# Patient Record
Sex: Female | Born: 1975 | Hispanic: Yes | Marital: Married | Smoking: Never smoker
Health system: Southern US, Community
[De-identification: ages and names within clinical notes are randomized; demographics above are authoritative.]

## PROBLEM LIST (undated history)

## (undated) DIAGNOSIS — E119 Type 2 diabetes mellitus without complications: Secondary | ICD-10-CM

---

## 2005-10-18 ENCOUNTER — Emergency Department: Payer: Self-pay | Admitting: Unknown Physician Specialty

## 2007-08-28 ENCOUNTER — Ambulatory Visit: Payer: Self-pay | Admitting: Internal Medicine

## 2014-07-11 ENCOUNTER — Emergency Department: Payer: Self-pay

## 2014-07-11 ENCOUNTER — Encounter: Payer: Self-pay | Admitting: Emergency Medicine

## 2014-07-11 ENCOUNTER — Emergency Department
Admission: EM | Admit: 2014-07-11 | Discharge: 2014-07-11 | Disposition: A | Discharge status: Home / Self Care | Payer: Self-pay | Attending: Emergency Medicine | Admitting: Emergency Medicine

## 2014-07-11 DIAGNOSIS — G44209 Tension-type headache, unspecified, not intractable: Secondary | ICD-10-CM | POA: Insufficient documentation

## 2014-07-11 HISTORY — DX: Type 2 diabetes mellitus without complications: E11.9

## 2014-07-11 LAB — GLUCOSE, CAPILLARY: GLUCOSE-CAPILLARY: 219 mg/dL — AB (ref 70–99)

## 2014-07-11 MED ORDER — SODIUM CHLORIDE 0.9 % IV SOLN
20.0000 mg | Freq: Once | INTRAVENOUS | Status: AC
  Administered 2014-07-11: 20 mg via INTRAVENOUS
  Filled 2014-07-11: qty 4

## 2014-07-11 MED ORDER — METOCLOPRAMIDE HCL 5 MG/ML IJ SOLN
INTRAMUSCULAR | Status: AC
  Filled 2014-07-11: qty 2

## 2014-07-11 MED ORDER — BUTALBITAL-APAP-CAFFEINE 50-325-40 MG PO TABS: 1.0000 | ORAL_TABLET | Freq: Four times a day (QID) | ORAL | Status: AC | PRN

## 2014-07-11 MED ORDER — DIPHENHYDRAMINE HCL 50 MG/ML IJ SOLN
INTRAMUSCULAR | Status: AC
  Administered 2014-07-11: 25 mg via INTRAVENOUS
  Filled 2014-07-11: qty 1

## 2014-07-11 MED ORDER — METOCLOPRAMIDE HCL 5 MG/ML IJ SOLN
20.0000 mg | Freq: Once | INTRAVENOUS | Status: DC
  Filled 2014-07-11: qty 4

## 2014-07-11 MED ORDER — DIPHENHYDRAMINE HCL 50 MG/ML IJ SOLN
25.0000 mg | Freq: Once | INTRAMUSCULAR | Status: AC
  Administered 2014-07-11: 25 mg via INTRAVENOUS

## 2014-07-11 NOTE — ED Notes (Signed)
Pt with headache x1 day , right eye blurry

## 2014-07-11 NOTE — ED Provider Notes (Signed)
[facility] Emergency Department Provider Note    ____________________________________________  Time seen: 8 AM  I have reviewed the triage vital signs and the nursing notes.   HISTORY  Chief Complaint Headache   Patient with limited English we'll have to use interpreter to get full history of present illness    HPI Lauren Burton is a 39 y.o. female who presents with a headache. The headache began yesterday afternoon and was mild and gradually got worse. It is primarily in the right forehead. She denies sinus drainage or fever. She has no neck pain. She has had headaches in the past and this is not the most severe she has ever had. No fevers no chills. No history of sick contacts. She describes her vision is clear. she has no neuro deficits. She has not tried any medications. Light and noise makes her headache worse.    No past medical history on file.  There are no active problems to display for this patient.   No past surgical history on file.  No current outpatient prescriptions on file.  Allergies Review of patient's allergies indicates no known allergies.  No family history on file.  Social History History  Substance Use Topics  . Smoking status: Not on file  . Smokeless tobacco: Not on file  . Alcohol Use: Not on file    Review of Systems  Constitutional: Negative for fever. Eyes: Negative for visual changes. ENT: Negative for sore throat. Cardiovascular: Negative for chest pain. Respiratory: Negative for shortness of breath. Gastrointestinal: Negative for abdominal pain, vomiting and diarrhea. Genitourinary: Negative for dysuria. Musculoskeletal: Negative for back pain. Skin: Negative for rash. Neurological:  Positive for headaches negative for focal weakness or numbness.  10-point ROS otherwise negative.  ____________________________________________   PHYSICAL EXAM:  VITAL SIGNS: ED Triage Vitals  Enc Vitals  Group     BP 07/11/14 0742 121/76 mmHg     Pulse Rate 07/11/14 0742 80     Resp 07/11/14 0742 20     Temp 07/11/14 0742 98.1 F (36.7 C)     Temp Source 07/11/14 0742 Oral     SpO2 07/11/14 0742 99 %     Weight 07/11/14 0742 135 lb (61.236 kg)     Height 07/11/14 0742 5\' 1"  (1.549 m)     Head Cir --      Peak Flow --      Pain Score 07/11/14 0735 8     Pain Loc --      Pain Edu? --      Excl. in GC? --      Constitutional: Alert and oriented. Well appearing and in no distress. Eyes: Conjunctivae are normal. PERRL. Normal extraocular movements. normal fundus bilaterally ENT   Head: Normocephalic and atraumatic.   Nose: No congestion/rhinnorhea. no tenderness to palpation over sinuses   Mouth/Throat: Mucous membranes are moist.   Neck: No stridor. Hematological/Lymphatic/Immunilogical: No cervical lymphadenopathy. Cardiovascular: Normal rate, regular rhythm. Normal and symmetric distal pulses are present in all extremities. No murmurs, rubs, or gallops. Respiratory: Normal respiratory effort without tachypnea nor retractions. Breath sounds are clear and equal bilaterally. No wheezes/rales/rhonchi. Gastrointestinal: Soft and nontender. No distention. No abdominal bruits. There is no CVA tenderness. Genitourinary:  Deferred Musculoskeletal: Nontender with normal range of motion in all extremities. No joint effusions.  No lower extremity tenderness nor edema. Neurologic:  Normal speech and language. No gross focal neurologic deficits are appreciated. Speech is normal. No gait instability. Skin:  Skin  is warm, dry and intact. No rash noted. Psychiatric: Mood and affect are normal. Speech and behavior are normal. Patient exhibits appropriate insight and judgment.  ____________________________________________    LABS (pertinent positives/negatives)  none  ____________________________________________   EKG  none  ____________________________________________     RADIOLOGY  CT head negative  ____________________________________________   PROCEDURES  Procedure(s) performed: None  Critical Care performed: No  ____________________________________________   INITIAL IMPRESSION / ASSESSMENT AND PLAN / ED COURSE  Pertinent labs & imaging results that were available during my care of the patient were reviewed by me and considered in my medical decision making (see chart for details).  ----------------------------------------- 9:57 AM on 07/11/2014 -----------------------------------------  Patient received Reglan IV and Benadryl IV. She reports her headache is now almost gone. On reexam no neurological episodes. Visual acuity normal. Recommend increased fluid intake and PCP follow-up. Patient will have someone drive her home  ____________________________________________   FINAL CLINICAL IMPRESSION(S) / ED DIAGNOSES  Final diagnoses:  Tension-type headache, not intractable, unspecified chronicity pattern     Jene Everyobert Markeisha Mancias, MD 07/11/14 1000

## 2014-07-11 NOTE — Discharge Instructions (Signed)
Dolor de cabeza por tensión  °(Tension Headache) ° Una cefalea tensional es un dolor o presión que se siente en la frente y los lados de la cabeza. Las cefaleas tensionales suelen aparecer después de una situación de estrés, preocupación (ansiedad) o por sentirse triste o caído por algún tiempo (deprimido). °CUIDADOS EN EL HOGAR  °· Sólo tome los medicamentos según le indique el médico. °· Cuando sienta dolor de cabeza acuéstese en un cuarto oscuro y tranquilo. °· Lleve un registro diario para averiguar si ciertas cosas provocan dolores de cabeza. Por ejemplo, escriba: °¨ Lo que usted come y bebe. °¨ Cuánto tiempo duerme. °¨ Algún cambio en su dieta o medicamentos. °· Relájese recibiendo masajes o haga otras actividades relajantes. °· Coloque hielo o calor en la cabeza y el cuello como lo indique su médico. °· DISMINUYA EL NIVEL DE ESTRÉS °· Siéntase con la espalda recta. No apriete los músculos (tensione). °· Si fuma, deje de hacerlo. °· Beba menos alcohol. °· Consuma menos o deje de tomar cafeína. °· Coma y haga ejercicios regularmente. °· Duerma lo suficiente. °· Evite usar mucha medicación para el dolor. °SOLICITE AYUDA DE INMEDIATO SI:  °· El dolor de cabeza empeora. °· Tiene fiebre. °· Presenta rigidez en el cuello. °· Tiene dificultad para ver. °· Sus músculos están débiles, o pierde el control muscular. °· Pierde el equilibrio o tiene problemas para caminar. °· Siente que se desvanece (débil) o se desmaya. °· Tiene malos síntomas que son diferentes a los primeros síntomas. °· Tiene problemas con los medicamentos que le haya dado su médico. °· El medicamento no le hace efecto. °· Siente que el dolor de cabeza es diferente a los otros dolores de cabeza. °· Tiene malestar estomacal (náuseas) o(vómitos). °ASEGÚRESE DE QUE:  °· Comprende estas instrucciones. °· Controlará su enfermedad. °· Solicitará ayuda de inmediato si no mejora o si empeora. °Document Released: 05/17/2011 °Exit[employer]® Patient Information ©2015  [employer]. This information is not intended to replace advice given to you by your health care provider. Make sure you discuss any questions you have with your health care provider. ° °

## 2014-07-11 NOTE — ED Notes (Signed)
Reports legs felt hot and weak this morning and started having palpitations. Currently denies palpitations.  Remains to have HA. Also still has weakness to legs.  C/o blurry vision in right.

## 2014-07-11 NOTE — ED Notes (Signed)
MD kinner at bedside. 

## 2014-07-11 NOTE — ED Notes (Signed)
Patient transported to CT 

## 2014-07-11 NOTE — ED Notes (Signed)
VSS. NAD. Pt verbalized understanding via interpretor.

## 2015-12-18 ENCOUNTER — Emergency Department
Admission: EM | Admit: 2015-12-18 | Discharge: 2015-12-18 | Disposition: A | Discharge status: Home / Self Care | Payer: Self-pay | Attending: Student in an Organized Health Care Education/Training Program | Admitting: Student in an Organized Health Care Education/Training Program

## 2015-12-18 ENCOUNTER — Encounter: Payer: Self-pay | Admitting: Emergency Medicine

## 2015-12-18 DIAGNOSIS — N760 Acute vaginitis: Secondary | ICD-10-CM

## 2015-12-18 DIAGNOSIS — B373 Candidiasis of vulva and vagina: Secondary | ICD-10-CM | POA: Insufficient documentation

## 2015-12-18 DIAGNOSIS — B3731 Acute candidiasis of vulva and vagina: Secondary | ICD-10-CM

## 2015-12-18 DIAGNOSIS — Z3490 Encounter for supervision of normal pregnancy, unspecified, unspecified trimester: Secondary | ICD-10-CM

## 2015-12-18 DIAGNOSIS — O23599 Infection of other part of genital tract in pregnancy, unspecified trimester: Secondary | ICD-10-CM | POA: Insufficient documentation

## 2015-12-18 DIAGNOSIS — Z3A00 Weeks of gestation of pregnancy not specified: Secondary | ICD-10-CM | POA: Insufficient documentation

## 2015-12-18 DIAGNOSIS — B9689 Other specified bacterial agents as the cause of diseases classified elsewhere: Secondary | ICD-10-CM

## 2015-12-18 LAB — WET PREP, GENITAL
SPERM: NONE SEEN
TRICH WET PREP: NONE SEEN
WBC WET PREP: NONE SEEN
YEAST WET PREP: NONE SEEN

## 2015-12-18 LAB — CHLAMYDIA/NGC RT PCR (ARMC ONLY)
Chlamydia Tr: NOT DETECTED
N gonorrhoeae: NOT DETECTED

## 2015-12-18 LAB — URINALYSIS COMPLETE WITH MICROSCOPIC (ARMC ONLY)
Bilirubin Urine: NEGATIVE
Hgb urine dipstick: NEGATIVE
Ketones, ur: NEGATIVE mg/dL
Leukocytes, UA: NEGATIVE
Nitrite: NEGATIVE
Protein, ur: NEGATIVE mg/dL
SPECIFIC GRAVITY, URINE: 1.025 (ref 1.005–1.030)
SQUAMOUS EPITHELIAL / LPF: NONE SEEN
pH: 5 (ref 5.0–8.0)

## 2015-12-18 LAB — POCT PREGNANCY, URINE: PREG TEST UR: POSITIVE — AB

## 2015-12-18 LAB — GLUCOSE, CAPILLARY: GLUCOSE-CAPILLARY: 267 mg/dL — AB (ref 65–99)

## 2015-12-18 LAB — PREGNANCY, URINE: Preg Test, Ur: POSITIVE — AB

## 2015-12-18 MED ORDER — FLUCONAZOLE 150 MG PO TABS: 150.0000 mg | ORAL_TABLET | Freq: Every day | ORAL | 0 refills | Status: DC

## 2015-12-18 MED ORDER — METRONIDAZOLE 500 MG PO TABS: 500.0000 mg | ORAL_TABLET | Freq: Two times a day (BID) | ORAL | 0 refills | Status: DC

## 2015-12-18 NOTE — ED Notes (Signed)
Pt in via triage with complaints of vaginal itching since last night, pt denies any burning, denies any discharge.  Pt A/Ox4, no immediate distress noted.

## 2015-12-18 NOTE — ED Provider Notes (Signed)
[facility] Emergency Department Provider Note  ____________________________________________  Time seen: Approximately 9:16 PM  I have reviewed the triage vital signs and the nursing notes.   HISTORY  Chief Complaint Vaginal Itching    HPI Aviva SignsJosefa Burton is a 40 y.o. female who presents to the emergency department for evaluation of vaginal itching and irritation. She states that she noticed it last night and it has gotten worse today. She denies pain and dysuria. She denies previous similar symptoms.  No past medical history on file.  There are no active problems to display for this patient.   No past surgical history on file.  Prior to Admission medications   Medication Sig Start Date End Date Taking? Authorizing Provider  fluconazole (DIFLUCAN) 150 MG tablet Take 1 tablet (150 mg total) by mouth daily. 12/18/15   Chinita Pesterari B Arnelle Nale, FNP  metroNIDAZOLE (FLAGYL) 500 MG tablet Take 1 tablet (500 mg total) by mouth 2 (two) times daily. 12/18/15   Chinita Pesterari B Nickey Kloepfer, FNP    Allergies Review of patient's allergies indicates no known allergies.  No family history on file.  Social History Social History  Substance Use Topics  . Smoking status: Not on file  . Smokeless tobacco: Not on file  . Alcohol use Not on file    Review of Systems Constitutional: Negative for fever. Respiratory: Negative for shortness of breath or cough. Gastrointestinal: Negative for abdominal pain; negative for nausea , negative for vomiting. Genitourinary: Negative for dysuria , Positive for vaginal discharge. Musculoskeletal: Negative for back pain. Skin: Negative for lesion or wound. ____________________________________________   PHYSICAL EXAM:  VITAL SIGNS: ED Triage Vitals [12/18/15 2031]  Enc Vitals Group     BP 125/84     Pulse Rate 82     Resp 18     Temp 98.4 F (36.9 C)     Temp Source Oral     SpO2 100 %     Weight 127 lb (57.6 kg)     Height 5\' 2"   (1.575 m)     Head Circumference      Peak Flow      Pain Score      Pain Loc      Pain Edu?      Excl. in GC?     Constitutional: Alert and oriented. Well appearing and in no acute distress. Eyes: Conjunctivae are normal. PERRL. EOMI. Head: Atraumatic. Nose: No congestion/rhinnorhea. Mouth/Throat: Mucous membranes are moist. Respiratory: Normal respiratory effort.  No retractions. Gastrointestinal: Abdomen soft, nontender, no rebound or guarding. Genitourinary: Pelvic exam: Thick, white, substance adhering to the vaginal walls consistent with concern for yeast infection. Yellow drainage noted at the cervical os. No cervical motion tenderness on exam. No adnexal tenderness on exam. Musculoskeletal: No extremity tenderness nor edema.  Neurologic:  Normal speech and language. No gross focal neurologic deficits are appreciated. Speech is normal. No gait instability. Skin:  Skin is warm, dry and intact. No rash noted. Psychiatric: Mood and affect are normal. Speech and behavior are normal.  ____________________________________________   LABS (all labs ordered are listed, but only abnormal results are displayed)  Labs Reviewed  WET PREP, GENITAL - Abnormal; Notable for the following:       Result Value   Clue Cells Wet Prep HPF POC PRESENT (*)    All other components within normal limits  URINALYSIS COMPLETEWITH MICROSCOPIC ([facility] ONLY) - Abnormal; Notable for the following:    Color, Urine STRAW (*)    APPearance CLEAR (*)  Glucose, UA >500 (*)    Bacteria, UA RARE (*)    All other components within normal limits  PREGNANCY, URINE - Abnormal; Notable for the following:    Preg Test, Ur POSITIVE (*)    All other components within normal limits  GLUCOSE, CAPILLARY - Abnormal; Notable for the following:    Glucose-Capillary 267 (*)    All other components within normal limits  POCT PREGNANCY, URINE - Abnormal; Notable for the following:    Preg Test, Ur POSITIVE (*)    All  other components within normal limits  CHLAMYDIA/NGC RT PCR ([facility] ONLY)  POC URINE PREG, ED  CBG MONITORING, ED   ____________________________________________  RADIOLOGY  Not indicated ____________________________________________   PROCEDURES  Procedure(s) performed: Pelvic exam-see above.  ____________________________________________   INITIAL IMPRESSION / ASSESSMENT AND PLAN / ED COURSE  Clinical Course     Pertinent labs & imaging results that were available during my care of the patient were reviewed by me and considered in my medical decision making (see chart for details).  Patient was advised via the Spanish interpreter that she is pregnant. Although very surprise, she states that she will follow-up with gynecology. She will call schedule a follow-up appointment. She was advised to start prenatal vitamins as soon as possible.  Patient will be given prescriptions for Flagyl and Diflucan today. She was advised to follow up with gynecology for symptoms that are not improving over the week. She was advised to return to the ER for symptoms that change or worsen if unable to schedule an appointment. ____________________________________________   FINAL CLINICAL IMPRESSION(S) / ED DIAGNOSES  Final diagnoses:  Pregnancy, unspecified gestational age  Bacterial vaginosis  Vaginal candida    Note:  This document was prepared using Dragon voice recognition software and may include unintentional dictation errors.    Chinita Pester, FNP 12/18/15 1610    Willy Eddy, MD 12/18/15 [phone]

## 2015-12-18 NOTE — ED Triage Notes (Signed)
Pt with co vaginal itching and burning since today. States she looked at vagina with mirror and noted a lot white patches in the vaginal wall. Denies any dysuria or abd pain.

## 2016-09-24 ENCOUNTER — Encounter: Payer: Self-pay | Admitting: Emergency Medicine

## 2017-07-22 ENCOUNTER — Ambulatory Visit
Admission: EM | Admit: 2017-07-22 | Discharge: 2017-07-22 | Disposition: A | Discharge status: Home / Self Care | Payer: Self-pay

## 2017-07-22 ENCOUNTER — Ambulatory Visit
Admission: RE | Admit: 2017-07-22 | Discharge: 2017-07-22 | Disposition: A | Discharge status: Home / Self Care | Payer: Worker's Compensation | Source: Ambulatory Visit | Attending: Physician Assistant | Admitting: Physician Assistant

## 2017-07-22 ENCOUNTER — Ambulatory Visit
Admission: RE | Admit: 2017-07-22 | Discharge: 2017-07-22 | Disposition: A | Discharge status: Home / Self Care | Payer: Self-pay | Source: Ambulatory Visit | Attending: Physician Assistant | Admitting: Physician Assistant

## 2017-07-22 ENCOUNTER — Other Ambulatory Visit: Payer: Self-pay | Admitting: Physician Assistant

## 2017-07-22 DIAGNOSIS — M25521 Pain in right elbow: Secondary | ICD-10-CM | POA: Insufficient documentation

## 2017-07-22 DIAGNOSIS — M79641 Pain in right hand: Secondary | ICD-10-CM | POA: Insufficient documentation

## 2017-07-22 DIAGNOSIS — M79631 Pain in right forearm: Secondary | ICD-10-CM | POA: Insufficient documentation

## 2017-07-22 DIAGNOSIS — W230XXA Caught, crushed, jammed, or pinched between moving objects, initial encounter: Secondary | ICD-10-CM

## 2018-02-28 ENCOUNTER — Emergency Department
Admission: EM | Admit: 2018-02-28 | Discharge: 2018-02-28 | Disposition: A | Discharge status: Home / Self Care | Payer: Self-pay | Attending: Emergency Medicine | Admitting: Emergency Medicine

## 2018-02-28 ENCOUNTER — Encounter: Payer: Self-pay | Admitting: Emergency Medicine

## 2018-02-28 ENCOUNTER — Other Ambulatory Visit: Payer: Self-pay

## 2018-02-28 DIAGNOSIS — R1110 Vomiting, unspecified: Secondary | ICD-10-CM | POA: Insufficient documentation

## 2018-02-28 DIAGNOSIS — R42 Dizziness and giddiness: Secondary | ICD-10-CM | POA: Insufficient documentation

## 2018-02-28 DIAGNOSIS — Z7984 Long term (current) use of oral hypoglycemic drugs: Secondary | ICD-10-CM | POA: Insufficient documentation

## 2018-02-28 DIAGNOSIS — E119 Type 2 diabetes mellitus without complications: Secondary | ICD-10-CM | POA: Insufficient documentation

## 2018-02-28 LAB — CBC
HCT: 40.4 % (ref 36.0–46.0)
Hemoglobin: 13.1 g/dL (ref 12.0–15.0)
MCH: 26.6 pg (ref 26.0–34.0)
MCHC: 32.4 g/dL (ref 30.0–36.0)
MCV: 81.9 fL (ref 80.0–100.0)
NRBC: 0 % (ref 0.0–0.2)
PLATELETS: 307 10*3/uL (ref 150–400)
RBC: 4.93 MIL/uL (ref 3.87–5.11)
RDW: 12.6 % (ref 11.5–15.5)
WBC: 9.7 10*3/uL (ref 4.0–10.5)

## 2018-02-28 LAB — BASIC METABOLIC PANEL
ANION GAP: 8 (ref 5–15)
BUN: 13 mg/dL (ref 6–20)
CALCIUM: 8.9 mg/dL (ref 8.9–10.3)
CO2: 24 mmol/L (ref 22–32)
Chloride: 107 mmol/L (ref 98–111)
Creatinine, Ser: 0.6 mg/dL (ref 0.44–1.00)
GFR calc non Af Amer: >60 mL/min (ref >60)
Glucose, Bld: 230 mg/dL — ABNORMAL HIGH (ref 70–99)
Potassium: 3.5 mmol/L (ref 3.5–5.1)
Sodium: 139 mmol/L (ref 135–145)

## 2018-02-28 LAB — GLUCOSE, CAPILLARY: Glucose-Capillary: 219 mg/dL — ABNORMAL HIGH (ref 70–99)

## 2018-02-28 MED ORDER — SODIUM CHLORIDE 0.9 % IV BOLUS
1000.0000 mL | Freq: Once | INTRAVENOUS | Status: AC
  Administered 2018-02-28: 1000 mL via INTRAVENOUS

## 2018-02-28 MED ORDER — MECLIZINE HCL 25 MG PO TABS
25.0000 mg | ORAL_TABLET | Freq: Once | ORAL | Status: AC
  Administered 2018-02-28: 25 mg via ORAL
  Filled 2018-02-28: qty 1

## 2018-02-28 MED ORDER — METOCLOPRAMIDE HCL 5 MG/ML IJ SOLN
10.0000 mg | Freq: Once | INTRAMUSCULAR | Status: AC
  Administered 2018-02-28: 10 mg via INTRAVENOUS
  Filled 2018-02-28: qty 2

## 2018-02-28 MED ORDER — ONDANSETRON 4 MG PO TBDP
4.0000 mg | ORAL_TABLET | Freq: Once | ORAL | Status: AC | PRN
  Administered 2018-02-28: 4 mg via ORAL
  Filled 2018-02-28: qty 1

## 2018-02-28 MED ORDER — DIPHENHYDRAMINE HCL 50 MG/ML IJ SOLN
25.0000 mg | Freq: Once | INTRAMUSCULAR | Status: AC
Start: 2018-02-28 — End: 2018-02-28
  Administered 2018-02-28: 25 mg via INTRAVENOUS
  Filled 2018-02-28: qty 1

## 2018-02-28 MED ORDER — MECLIZINE HCL 25 MG PO TABS: 25.0000 mg | ORAL_TABLET | Freq: Three times a day (TID) | ORAL | 0 refills | Status: AC | PRN

## 2018-02-28 MED ORDER — ONDANSETRON 4 MG PO TBDP: 4.0000 mg | ORAL_TABLET | Freq: Three times a day (TID) | ORAL | 0 refills | Status: DC | PRN

## 2018-02-28 NOTE — ED Provider Notes (Signed)
[facility] Emergency Department Provider Note  ____________________________________________  Time seen: Approximately 7:37 AM  I have reviewed the triage vital signs and the nursing notes.   HISTORY  Chief Complaint Dizziness and Vomiting   HPI Lauren Burton is a 42 y.o. female with a history of diabetes on metformin who presents for evaluation of vertigo.  Patient reports that her symptoms started suddenly at 4 AM.  She describes this as room spinning sensation.  She has had severe nausea and vomiting with that.  She reports that initially the symptoms were constant and even if she laid still but right now the symptoms have improved significantly last that she needs to move.  She denies headache, personal or family history of stroke, personal history of smoking, diplopia, dysarthria, dysphasia, unilateral weakness or numbness.  Patient denies any tinnitus or ear pain, no sore throat.  Past Medical History:  Diagnosis Date  . Diabetes mellitus without complication (HCC)      Prior to Admission medications   Medication Sig Start Date End Date Taking? Authorizing Provider  fluconazole (DIFLUCAN) 150 MG tablet Take 1 tablet (150 mg total) by mouth daily. 12/18/15   Triplett, Rulon Eisenmengerari B, FNP  meclizine (ANTIVERT) 25 MG tablet Take 1 tablet (25 mg total) by mouth 3 (three) times daily as needed for dizziness. 02/28/18   Nita SickleVeronese, [place], MD  metFORMIN (GLUCOPHAGE) 1000 MG tablet Take 1,000 mg by mouth 2 (two) times daily with a meal.    [provider]  metroNIDAZOLE (FLAGYL) 500 MG tablet Take 1 tablet (500 mg total) by mouth 2 (two) times daily. 12/18/15   Triplett, Cari B, FNP  ondansetron (ZOFRAN ODT) 4 MG disintegrating tablet Take 1 tablet (4 mg total) by mouth every 8 (eight) hours as needed. 02/28/18   Nita SickleVeronese, [place], MD    Allergies Patient has no known allergies.  No family history on file.  Social History Social History    Tobacco Use  . Smoking status: Never Smoker  . Smokeless tobacco: Never Used  Substance Use Topics  . Alcohol use: No  . Drug use: Never    Review of Systems  Constitutional: Negative for fever. Eyes: Negative for visual changes. ENT: Negative for sore throat. Neck: No neck pain  Cardiovascular: Negative for chest pain. Respiratory: Negative for shortness of breath. Gastrointestinal: Negative for abdominal pain or diarrhea. + nausea and vomiting Genitourinary: Negative for dysuria. Musculoskeletal: Negative for back pain. Skin: Negative for rash. Neurological: Negative for headaches, weakness or numbness. + vertigo Psych: No SI or HI  ____________________________________________   PHYSICAL EXAM:  VITAL SIGNS: ED Triage Vitals  Enc Vitals Group     BP 02/28/18 0648 126/79     Pulse Rate 02/28/18 0648 73     Resp 02/28/18 0648 20     Temp 02/28/18 0648 97.7 F (36.5 C)     Temp Source 02/28/18 0648 Oral     SpO2 02/28/18 0648 100 %     Weight 02/28/18 0649 125 lb (56.7 kg)     Height 02/28/18 0649 5\' 2"  (1.575 m)     Head Circumference --      Peak Flow --      Pain Score 02/28/18 0649 0     Pain Loc --      Pain Edu? --      Excl. in GC? --     Constitutional: Alert and oriented, looks uncomfortable but in no distress.  HEENT:  Head: Normocephalic and atraumatic.         Eyes: Conjunctivae are normal. Sclera is non-icteric.       Mouth/Throat: Mucous membranes are moist.       Ears: TMs visualized and clear bilaterally      Neck: Supple with no signs of meningismus. Cardiovascular: Regular rate and rhythm. No murmurs, gallops, or rubs. 2+ symmetrical distal pulses are present in all extremities. No JVD. Respiratory: Normal respiratory effort. Lungs are clear to auscultation bilaterally. No wheezes, crackles, or rhonchi.  Gastrointestinal: Soft, non tender, and non distended with positive bowel sounds. No rebound or guarding. Musculoskeletal: Nontender  with normal range of motion in all extremities. No edema, cyanosis, or erythema of extremities. Neurologic: Normal speech and language. A & O x3, PERRL, EOMI, no nystagmus, CN II-XII intact, motor testing reveals good tone and bulk throughout. There is no evidence of pronator drift or dysmetria. Muscle strength is 5/5 throughout.  Sensory examination is intact. Gait deferred at this time Skin: Skin is warm, dry and intact. No rash noted. Psychiatric: Mood and affect are normal. Speech and behavior are normal.  ____________________________________________   LABS (all labs ordered are listed, but only abnormal results are displayed)  Labs Reviewed  GLUCOSE, CAPILLARY - Abnormal; Notable for the following components:      Result Value   Glucose-Capillary 219 (*)    All other components within normal limits  BASIC METABOLIC PANEL - Abnormal; Notable for the following components:   Glucose, Bld 230 (*)    All other components within normal limits  CBC  URINALYSIS, COMPLETE (UACMP) WITH MICROSCOPIC  CBG MONITORING, ED  POC URINE PREG, ED   ____________________________________________  EKG  ED ECG REPORT I, Nita Sickle, the attending physician, personally viewed and interpreted this ECG.  Normal sinus rhythm with first-degree AV block, rate of 79, normal QRS and QTC, normal axis, no ST elevations or depressions.  Otherwise normal EKG ____________________________________________  RADIOLOGY  none  ____________________________________________   PROCEDURES  Procedure(s) performed: None Procedures Critical Care performed:  None ____________________________________________   INITIAL IMPRESSION / ASSESSMENT AND PLAN / ED COURSE  42 y.o. female with a history of diabetes on metformin who presents for evaluation of sudden onset severe positional vertigo associated with nausea and vomiting. Patient is neurologically intact, actively dry heaving. Exam non focal otherwise. Based  on patient's age and neuro exam, and history low suspicion for central cause. Will give Reglan, benadryl, and meclizine. IVF. Will reassess once symptoms are improved. Labs showing mild hyperglycemia no evidence of DKA, normal electrolytes, no evidence of anemia.  EKG with no evidence of dysrhythmias or ischemia.  Clinical Course as of Feb 28 1050  Tue Feb 28, 2018  1037 Patient reports full resolution of her symptoms.  Walking with normal gait and no further episodes of vertigo.  Remains neurologically intact.  At this time she will be discharged home with meclizine and Zofran.  Recommend close follow-up with primary care doctor.  Discussed standard return precautions for recurrence of her symptoms or signs of stroke.    [CV]    Clinical Course User Index [CV] Don Perking [place], MD     As part of my medical decision making, I reviewed the following data within the electronic MEDICAL RECORD NUMBER Nursing notes reviewed and incorporated, Labs reviewed , EKG interpreted , Old chart reviewed, Notes from prior ED visits and  Controlled Substance Database    Pertinent labs & imaging results that were available during my care  of the patient were reviewed by me and considered in my medical decision making (see chart for details).    ____________________________________________   FINAL CLINICAL IMPRESSION(S) / ED DIAGNOSES  Final diagnoses:  Vertigo      NEW MEDICATIONS STARTED DURING THIS VISIT:  ED Discharge Orders         Ordered    meclizine (ANTIVERT) 25 MG tablet  3 times daily PRN     02/28/18 1038    ondansetron (ZOFRAN ODT) 4 MG disintegrating tablet  Every 8 hours PRN     02/28/18 1038           Note:  This document was prepared using Dragon voice recognition software and may include unintentional dictation errors.    Don PerkingVeronese, [place], MD 02/28/18 1051

## 2018-02-28 NOTE — ED Triage Notes (Addendum)
Pt presents to ED with frequent vomiting for the past 2 hours. Pt reports feeling weak and dizzy. Pt answering questions but falls asleep quickly when not verbally stimulated. Last time vomiting was during triage. Denies pain.

## 2019-03-21 IMAGING — CR DG HAND COMPLETE 3+V*R*
3 series · 3 of 3 positions shown · non-contrast
Comparison: None

CLINICAL DATA: Caught RIGHT arm in a manufacturing machine,
swelling RIGHT hand, scratches at RIGHT hand, wrist and forearm,
forearm and wrist pain, initial encounter

EXAM:
RIGHT HAND - COMPLETE 3+ VIEW

[hand ap]
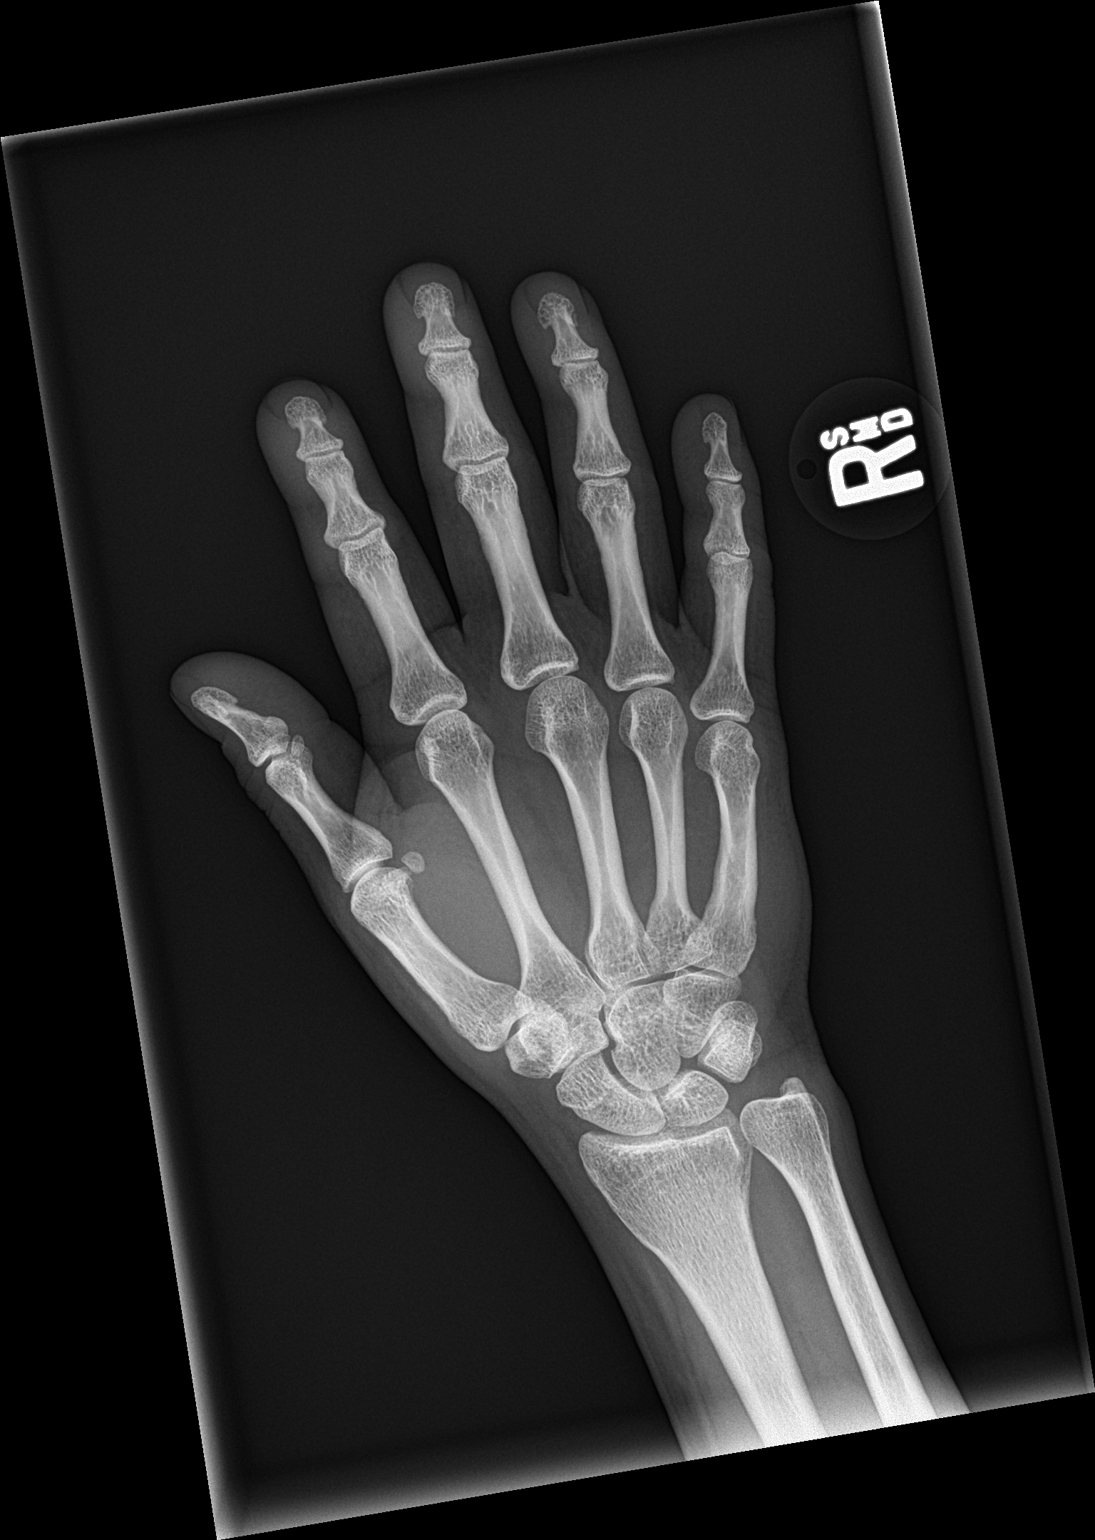

[hand obl]
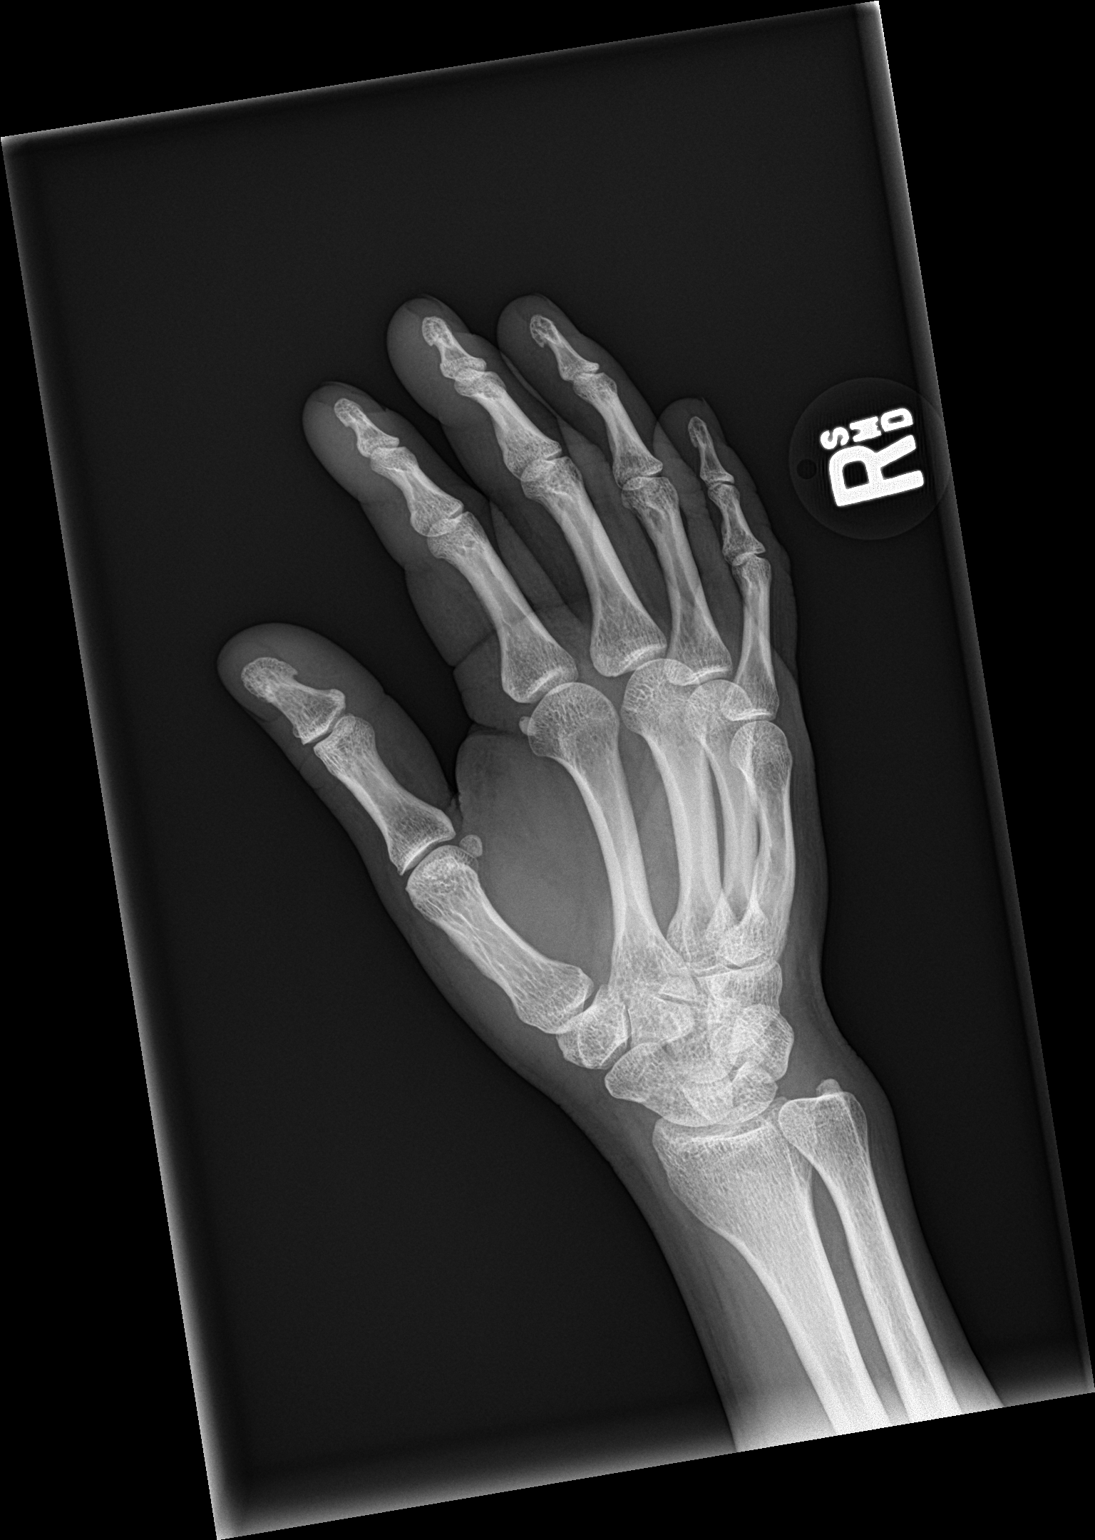

[hand lat]
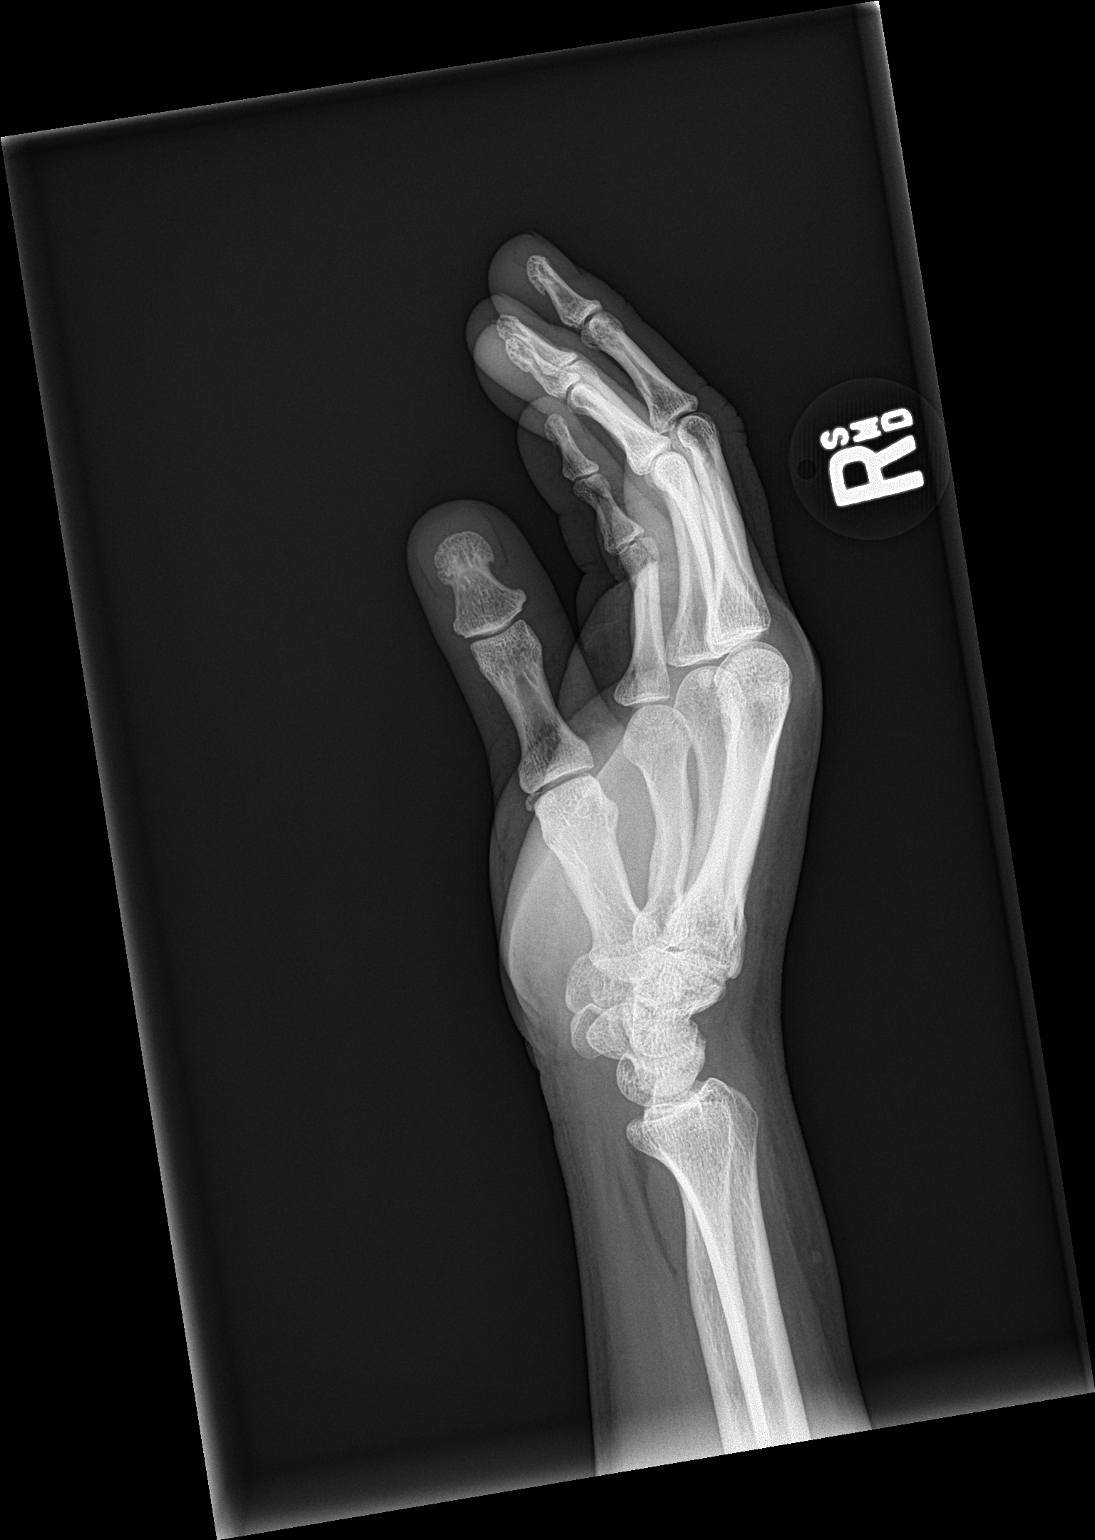

[3 of 3 positions shown; findings below may reference images not displayed]

FINDINGS: Osseous mineralization normal.

Joint spaces preserved.

No fracture, dislocation, or bone destruction.

Dorsal soft tissue swelling overlying the hand.

No radiopaque foreign bodies.
IMPRESSION: No acute osseous abnormalities.

## 2019-05-05 ENCOUNTER — Emergency Department
Admission: EM | Admit: 2019-05-05 | Discharge: 2019-05-05 | Disposition: A | Discharge status: Home / Self Care | Payer: Self-pay | Attending: Emergency Medicine | Admitting: Emergency Medicine

## 2019-05-05 ENCOUNTER — Encounter: Payer: Self-pay | Admitting: Emergency Medicine

## 2019-05-05 ENCOUNTER — Other Ambulatory Visit: Payer: Self-pay

## 2019-05-05 DIAGNOSIS — R509 Fever, unspecified: Secondary | ICD-10-CM

## 2019-05-05 DIAGNOSIS — N390 Urinary tract infection, site not specified: Secondary | ICD-10-CM | POA: Insufficient documentation

## 2019-05-05 DIAGNOSIS — Z7984 Long term (current) use of oral hypoglycemic drugs: Secondary | ICD-10-CM | POA: Insufficient documentation

## 2019-05-05 DIAGNOSIS — Z79899 Other long term (current) drug therapy: Secondary | ICD-10-CM | POA: Insufficient documentation

## 2019-05-05 DIAGNOSIS — M7918 Myalgia, other site: Secondary | ICD-10-CM | POA: Insufficient documentation

## 2019-05-05 DIAGNOSIS — E119 Type 2 diabetes mellitus without complications: Secondary | ICD-10-CM | POA: Insufficient documentation

## 2019-05-05 LAB — URINALYSIS, COMPLETE (UACMP) WITH MICROSCOPIC
Bilirubin Urine: NEGATIVE
Glucose, UA: >=500 mg/dL — AB
Hgb urine dipstick: NEGATIVE
Ketones, ur: 20 mg/dL — AB
Nitrite: POSITIVE — AB
Protein, ur: NEGATIVE mg/dL
Specific Gravity, Urine: 1.01 (ref 1.005–1.030)
WBC, UA: >50 WBC/hpf — ABNORMAL HIGH (ref 0–5)
pH: 6 (ref 5.0–8.0)

## 2019-05-05 MED ORDER — SODIUM CHLORIDE 0.9 % IV SOLN
1.0000 g | Freq: Once | INTRAVENOUS | Status: AC
  Administered 2019-05-05: 10:00:00 1 g via INTRAVENOUS
  Filled 2019-05-05: qty 10

## 2019-05-05 MED ORDER — ACETAMINOPHEN 500 MG PO TABS
1000.0000 mg | ORAL_TABLET | Freq: Once | ORAL | Status: AC
  Administered 2019-05-05: 1000 mg via ORAL
  Filled 2019-05-05: qty 2

## 2019-05-05 MED ORDER — SULFAMETHOXAZOLE-TRIMETHOPRIM 800-160 MG PO TABS: 1.0000 | ORAL_TABLET | Freq: Two times a day (BID) | ORAL | 0 refills | Status: AC

## 2019-05-05 NOTE — ED Triage Notes (Signed)
Pt to ED via POV c/o body aches and fever. Pt states that symptoms started yesterday. Pt denies any other symptoms. Pt is in NAD.

## 2019-05-05 NOTE — Discharge Instructions (Signed)
Follow-up with your primary care provider if any continued problems.  Begin taking antibiotics as directed twice a day for the next 10 days.  Also Tylenol if needed for fever.  Increase fluids.  Return to the emergency department if any severe worsening of your symptoms

## 2019-05-05 NOTE — ED Provider Notes (Signed)
[facility] Emergency Department Provider Note  ____________________________________________   First MD Initiated Contact with Patient 05/05/19 [phone]     (approximate)  I have reviewed the triage vital signs and the nursing notes.   HISTORY  Chief Complaint Fever and Generalized Body Aches Per Spanish interpreter  HPI Lauren Burton is a 44 y.o. female presents to the ED with complaint of body aches and fever.  Patient states that symptoms started yesterday and she is taken Tylenol once yesterday.  She was not aware that she had a fever this morning.  She denies any nausea, vomiting, diarrhea.  There is been no exposure to Covid.  She states that there is no change in her taste or smell.  She denies any URI symptoms.  Patient has generalized body aches but states it is worse in her back.  Patient reports that there has been some urinary frequency.  She rates her pain as a 10 out of 10.       Past Medical History:  Diagnosis Date  . Diabetes mellitus without complication ([place])     There are no problems to display for this patient.   History reviewed. No pertinent surgical history.  Prior to Admission medications   Medication Sig Start Date End Date Taking? Authorizing Provider  metFORMIN (GLUCOPHAGE) 1000 MG tablet Take 1,000 mg by mouth 2 (two) times daily with a meal.   Yes [provider]  meclizine (ANTIVERT) 25 MG tablet Take 1 tablet (25 mg total) by mouth 3 (three) times daily as needed for dizziness. 02/28/18   Rudene Re, MD  sulfamethoxazole-trimethoprim (BACTRIM DS) 800-160 MG tablet Take 1 tablet by mouth 2 (two) times daily. 05/05/19   Johnn Hai, PA-C    Allergies Patient has no known allergies.  No family history on file.  Social History Social History   Tobacco Use  . Smoking status: Never Smoker  . Smokeless tobacco: Never Used  Substance Use Topics  . Alcohol use: No  . Drug use: Never     Review of Systems Constitutional: Subjective fever/chills Eyes: No visual changes. ENT: No sore throat. Cardiovascular: Denies chest pain. Respiratory: Denies shortness of breath.  Denies cough. Gastrointestinal: No abdominal pain.  No nausea, no vomiting.  No diarrhea. Genitourinary: Positive urinary frequency. Musculoskeletal: Positive muscle aches. Skin: Negative for rash. Neurological: Negative for headaches, focal weakness or numbness. ____________________________________________   PHYSICAL EXAM:  VITAL SIGNS: ED Triage Vitals  Enc Vitals Group     BP 05/05/19 0825 132/73     Pulse Rate 05/05/19 0825 (!) 122     Resp 05/05/19 0825 16     Temp 05/05/19 0825 (!) 101.2 F (38.4 C)     Temp Source 05/05/19 0825 Oral     SpO2 05/05/19 0825 100 %     Weight 05/05/19 0827 125 lb (56.7 kg)     Height 05/05/19 0827 5\' 2"  (1.575 m)     Head Circumference --      Peak Flow --      Pain Score 05/05/19 0826 10     Pain Loc --      Pain Edu? --      Excl. in [place]? --     Constitutional: Alert and oriented. Well appearing and in no acute distress. Eyes: Conjunctivae are normal.  Head: Atraumatic. Nose: No congestion/rhinnorhea.  EACs and TMs are clear. Mouth/Throat: Mucous membranes are moist.  Oropharynx non-erythematous. Neck: No stridor.   Hematological/Lymphatic/Immunilogical: No cervical  lymphadenopathy. Cardiovascular: Normal rate, regular rhythm. Grossly normal heart sounds.  Good peripheral circulation. Respiratory: Normal respiratory effort.  No retractions. Lungs CTAB. Gastrointestinal: Soft and nontender. No distention.  No CVA tenderness. Musculoskeletal: Moves upper and lower extremities without any difficulty normal gait was noted. Neurologic:  Normal speech and language. No gross focal neurologic deficits are appreciated. No gait instability. Skin:  Skin is warm, dry and intact. No rash noted. Psychiatric: Mood and affect are normal. Speech and behavior are  normal.  ____________________________________________   LABS (all labs ordered are listed, but only abnormal results are displayed)  Labs Reviewed  URINALYSIS, COMPLETE (UACMP) WITH MICROSCOPIC - Abnormal; Notable for the following components:      Result Value   Color, Urine YELLOW (*)    APPearance HAZY (*)    Glucose, UA >=500 (*)    Ketones, ur 20 (*)    Nitrite POSITIVE (*)    Leukocytes,Ua LARGE (*)    WBC, UA >50 (*)    Bacteria, UA RARE (*)    All other components within normal limits    PROCEDURES  Procedure(s) performed (including Critical Care):  Procedures   ____________________________________________   INITIAL IMPRESSION / ASSESSMENT AND PLAN / ED COURSE  As part of my medical decision making, I reviewed the following data within the electronic MEDICAL RECORD NUMBER Notes from prior ED visits and [place] Controlled Substance Database  Enna Karl Luke was evaluated in Emergency Department on 05/05/2019 for the symptoms described in the history of present illness. She was evaluated in the context of the global COVID-19 pandemic, which necessitated consideration that the patient might be at risk for infection with the SARS-CoV-2 virus that causes COVID-19. Institutional protocols and algorithms that pertain to the evaluation of patients at risk for COVID-19 are in a state of rapid change based on information released by regulatory bodies including the CDC and federal and state organizations. These policies and algorithms were followed during the patient's care in the ED.  44 year old female presents to the ED with complaint of body aches and fever that began yesterday.  Patient denies any known exposure to Covid.  She states that she has taken Tylenol once and did admit to some urinary frequency.  She denies any nausea, vomiting or diarrhea.  She denies any cough or respiratory symptoms.  Urinalysis is consistent with an acute UTI.  Patient was told per Spanish interpreter  and understands.  Rocephin 1 g IV was given to the patient while she was in the ED and was afebrile at the time she was discharged.  She was made aware that there is a prescription sent to her pharmacy to continue taking.  She is also aware that she needs to continue taking Tylenol or ibuprofen as needed for fever and also for body aches.  She is to follow-up with her PCP if any continued problems.  ____________________________________________   FINAL CLINICAL IMPRESSION(S) / ED DIAGNOSES  Final diagnoses:  Acute urinary tract infection  Fever in adult     ED Discharge Orders         Ordered    sulfamethoxazole-trimethoprim (BACTRIM DS) 800-160 MG tablet  2 times daily     05/05/19 1134           Note:  This document was prepared using Dragon voice recognition software and may include unintentional dictation errors.    Tommi Rumps, PA-C 05/05/19 1142    Jene Every, MD 05/05/19 [phone]

## 2019-05-27 ENCOUNTER — Ambulatory Visit: Payer: Self-pay | Attending: Internal Medicine

## 2019-05-27 DIAGNOSIS — Z23 Encounter for immunization: Secondary | ICD-10-CM

## 2019-05-27 NOTE — Progress Notes (Signed)
   Covid-19 Vaccination Clinic  Name:  Rise Traeger    MRN: [id number] DOB: [birth year]  05/27/2019  Ms. Karl Luke was observed post Covid-19 immunization for 15 minutes without incident. She was provided with Vaccine Information Sheet and instruction to access the V-Safe system.   Ms. Karl Luke was instructed to call 911 with any severe reactions post vaccine: Marland Kitchen Difficulty breathing  . Swelling of face and throat  . A fast heartbeat  . A bad rash all over body  . Dizziness and weakness   Immunizations Administered    Name Date Dose VIS Date Route   Pfizer COVID-19 Vaccine 05/27/2019  5:53 PM 0.3 mL 02/16/2019 Intramuscular   Manufacturer: [employer], [employer]   Lot: [id number]   NDC: [address]-9

## 2019-06-17 ENCOUNTER — Ambulatory Visit: Payer: Self-pay | Attending: Internal Medicine

## 2019-06-17 DIAGNOSIS — Z23 Encounter for immunization: Secondary | ICD-10-CM

## 2019-06-17 NOTE — Progress Notes (Signed)
   Covid-19 Vaccination Clinic  Name:  Nastassja Witkop    MRN: [id number] DOB: 1976-03-08  06/17/2019  Ms. Diaz-Salinas was observed post Covid-19 immunization for 15 minutes   without incident. She was provided with Vaccine Information Sheet and instruction to access the V-Safe system.   Ms. Taite was instructed to call 911 with any severe reactions post vaccine: Marland Kitchen Difficulty breathing  . Swelling of face and throat  . A fast heartbeat  . A bad rash all over body  . Dizziness and weakness   Immunizations Administered    Name Date Dose VIS Date Route   Pfizer COVID-19 Vaccine 06/17/2019  3:43 PM 0.3 mL 02/16/2019 Intramuscular   Manufacturer: [employer], [employer]   Lot: [id number]   NDC: [address]-6

## 2022-08-04 ENCOUNTER — Encounter: Payer: Self-pay | Admitting: Physician Assistant

## 2022-08-05 ENCOUNTER — Other Ambulatory Visit: Payer: Self-pay

## 2022-08-05 DIAGNOSIS — Z1231 Encounter for screening mammogram for malignant neoplasm of breast: Secondary | ICD-10-CM

## 2022-10-12 ENCOUNTER — Ambulatory Visit: Payer: Self-pay

## 2023-01-03 ENCOUNTER — Ambulatory Visit: Payer: Self-pay

## 2023-01-17 ENCOUNTER — Ambulatory Visit: Payer: Self-pay | Attending: Obstetrics and Gynecology

## 2023-01-17 ENCOUNTER — Ambulatory Visit: Payer: Self-pay | Attending: Hematology and Oncology

## 2023-01-17 ENCOUNTER — Telehealth: Payer: Self-pay | Admitting: Hematology and Oncology

## 2023-12-27 ENCOUNTER — Telehealth: Payer: Self-pay
